# Patient Record
Sex: Female | Born: 1965 | Race: Black or African American | Hispanic: No | Smoking: Never smoker
Health system: Southern US, Community
[De-identification: ages and names within clinical notes are randomized; demographics above are authoritative.]

## PROBLEM LIST (undated history)

## (undated) DIAGNOSIS — T7840XA Allergy, unspecified, initial encounter: Secondary | ICD-10-CM

## (undated) HISTORY — DX: Allergy, unspecified, initial encounter: T78.40XA

---

## 2004-07-08 ENCOUNTER — Other Ambulatory Visit: Admission: RE | Admit: 2004-07-08 | Discharge: 2004-07-08 | Payer: Self-pay | Admitting: Obstetrics and Gynecology

## 2012-03-13 DIAGNOSIS — K589 Irritable bowel syndrome without diarrhea: Secondary | ICD-10-CM | POA: Insufficient documentation

## 2012-03-13 DIAGNOSIS — Z87898 Personal history of other specified conditions: Secondary | ICD-10-CM | POA: Insufficient documentation

## 2012-03-13 DIAGNOSIS — K219 Gastro-esophageal reflux disease without esophagitis: Secondary | ICD-10-CM | POA: Insufficient documentation

## 2013-07-31 ENCOUNTER — Other Ambulatory Visit (HOSPITAL_COMMUNITY): Payer: Self-pay | Admitting: *Deleted

## 2014-01-06 DIAGNOSIS — D126 Benign neoplasm of colon, unspecified: Secondary | ICD-10-CM | POA: Insufficient documentation

## 2014-01-13 ENCOUNTER — Telehealth (HOSPITAL_COMMUNITY): Payer: Self-pay | Admitting: *Deleted

## 2014-02-12 ENCOUNTER — Telehealth (HOSPITAL_COMMUNITY): Payer: Self-pay | Admitting: *Deleted

## 2014-02-17 ENCOUNTER — Other Ambulatory Visit (HOSPITAL_COMMUNITY): Payer: Self-pay | Admitting: Family Medicine

## 2014-02-17 ENCOUNTER — Encounter (HOSPITAL_COMMUNITY): Payer: Self-pay | Admitting: *Deleted

## 2014-02-17 DIAGNOSIS — R072 Precordial pain: Secondary | ICD-10-CM

## 2014-03-05 ENCOUNTER — Telehealth (HOSPITAL_COMMUNITY): Payer: Self-pay

## 2014-03-05 NOTE — Telephone Encounter (Signed)
Encounter complete. 

## 2014-03-07 ENCOUNTER — Encounter (HOSPITAL_COMMUNITY): Payer: Self-pay

## 2014-03-10 ENCOUNTER — Encounter (HOSPITAL_COMMUNITY): Payer: Self-pay

## 2014-03-10 ENCOUNTER — Telehealth (HOSPITAL_COMMUNITY): Payer: Self-pay | Admitting: *Deleted

## 2014-03-25 ENCOUNTER — Telehealth (HOSPITAL_COMMUNITY): Payer: Self-pay

## 2014-03-25 NOTE — Telephone Encounter (Signed)
Encounter complete. 

## 2014-03-27 ENCOUNTER — Ambulatory Visit (HOSPITAL_COMMUNITY)
Admission: RE | Admit: 2014-03-27 | Discharge: 2014-03-27 | Disposition: A | Discharge status: Home / Self Care | Payer: BC Managed Care – PPO | Source: Ambulatory Visit | Attending: Family Medicine | Admitting: Family Medicine

## 2014-03-27 DIAGNOSIS — R072 Precordial pain: Secondary | ICD-10-CM | POA: Diagnosis present

## 2014-03-27 NOTE — Procedures (Signed)
Exercise Treadmill Test  Pre-Exercise Testing Evaluation Rhythm: normal sinus                  Test  Exercise Tolerance Test Ordering MD: Billey Chang, MD    Unique Test No: 1  Treadmill:  1  Indication for ETT: chest pain - rule out ischemia  Contraindication to ETT: No   Stress Modality: exercise - treadmill  Cardiac Imaging Performed: non   Protocol: standard Bruce - maximal  Max BP:  136/96  Max MPHR (bpm):  170 85% MPR (bpm):  144  MPHR obtained (bpm):  184 %MPHR:  108  Reached 85% MPHR (min:sec):  6 Total Exercise Time (min-sec):  10  Workload in METS:  11.7 Borg Scale: 16  Reason ETT Terminated:  General and Leg Fatigue    ST Segment Analysis At Rest: normal ST segments - no evidence of significant ST depression With Exercise: no evidence of significant ST depression  Other Information Arrhythmia:  No Angina during ETT:  absent (0) Quality of ETT:  diagnostic  ETT Interpretation:  normal - no evidence of ischemia by ST analysis  Comments: Nl GXT  Recommendations: Follow up with Dr. Jonni Sanger

## 2014-06-20 ENCOUNTER — Other Ambulatory Visit: Payer: Self-pay | Admitting: Obstetrics and Gynecology

## 2014-06-22 NOTE — Patient Instructions (Signed)
   Your procedure is scheduled on:  Thursday, May 19  Enter through the Main Entrance of [facility] at: 9:15 AM Pick up the phone at the desk and dial [phone] and inform us of your arrival.  Please call this number if you have any problems the morning of surgery: [phone]  Remember: Do not eat or drink after midnight: Wednesday Take these medicines the morning of surgery with a SIP OF WATER:  Do not wear jewelry, make-up, or FINGER nail polish No metal in your hair or on your body. Do not wear lotions, powders, perfumes.  You may wear deodorant.  Do not bring valuables to the hospital. Contacts, dentures or bridgework may not be worn into surgery.  Leave suitcase in the car. After Surgery it may be brought to your room. For patients being admitted to the hospital, checkout time is 11:00am the day of discharge.    Patients discharged on the day of surgery will not be allowed to drive home.

## 2014-06-23 ENCOUNTER — Inpatient Hospital Stay (HOSPITAL_COMMUNITY)
Admission: RE | Admit: 2014-06-23 | Discharge: 2014-06-23 | Disposition: A | Discharge status: Home / Self Care | Payer: BC Managed Care – PPO | Source: Ambulatory Visit

## 2014-06-27 ENCOUNTER — Encounter (HOSPITAL_COMMUNITY): Payer: Self-pay

## 2014-06-27 ENCOUNTER — Encounter (HOSPITAL_COMMUNITY)
Admission: RE | Admit: 2014-06-27 | Discharge: 2014-06-27 | Disposition: A | Discharge status: Home / Self Care | Payer: BC Managed Care – PPO | Source: Ambulatory Visit | Attending: Obstetrics and Gynecology | Admitting: Obstetrics and Gynecology

## 2014-06-27 DIAGNOSIS — Z01818 Encounter for other preprocedural examination: Secondary | ICD-10-CM | POA: Insufficient documentation

## 2014-06-27 LAB — BASIC METABOLIC PANEL
Anion gap: 11 (ref 5–15)
BUN: 16 mg/dL (ref 6–20)
CALCIUM: 8.9 mg/dL (ref 8.9–10.3)
CO2: 23 mmol/L (ref 22–32)
CREATININE: 1.02 mg/dL — AB (ref 0.44–1.00)
Chloride: 102 mmol/L (ref 101–111)
Glucose, Bld: 105 mg/dL — ABNORMAL HIGH (ref 65–99)
Potassium: 3.9 mmol/L (ref 3.5–5.1)
SODIUM: 136 mmol/L (ref 135–145)

## 2014-06-27 LAB — CBC
HEMATOCRIT: 38.1 % (ref 36.0–46.0)
Hemoglobin: 12.9 g/dL (ref 12.0–15.0)
MCH: 30.9 pg (ref 26.0–34.0)
MCHC: 33.9 g/dL (ref 30.0–36.0)
MCV: 91.1 fL (ref 78.0–100.0)
Platelets: 318 10*3/uL (ref 150–400)
RBC: 4.18 MIL/uL (ref 3.87–5.11)
RDW: 13.4 % (ref 11.5–15.5)
WBC: 6.6 10*3/uL (ref 4.0–10.5)

## 2014-06-27 NOTE — Patient Instructions (Addendum)
   Your procedure is scheduled on: Jul 03 2014   Enter through the Main Entrance of [facility] at : [place] up the phone at the desk and dial [phone] and inform us of your arrival.  Please call this number if you have any problems the morning of surgery: [phone]  Remember: Do not eat food after midnight: MAY 18 Do not drink clear liquids after: 630AM DAY OF SURGERY Take these medicines the morning of surgery with a SIP OF WATER:  NONE  Do not wear jewelry, make-up, or FINGER nail polish No metal in your hair or on your body. Do not wear lotions, powders, perfumes.  You may wear deodorant.  Do not bring valuables to the hospital. Contacts, dentures or bridgework may not be worn into surgery.  Leave suitcase in the car. After Surgery it may be brought to your room. For patients being admitted to the hospital, checkout time is 11:00am the day of discharge.

## 2014-07-02 MED ORDER — CIPROFLOXACIN IN D5W 400 MG/200ML IV SOLN
400.0000 mg | INTRAVENOUS | Status: AC
  Administered 2014-07-03: 400 mg via INTRAVENOUS
  Filled 2014-07-02: qty 200

## 2014-07-02 MED ORDER — CLINDAMYCIN PHOSPHATE 900 MG/50ML IV SOLN
900.0000 mg | INTRAVENOUS | Status: AC
Start: 2014-07-03 — End: 2014-07-03
  Administered 2014-07-03: 900 mg via INTRAVENOUS
  Filled 2014-07-02: qty 50

## 2014-07-03 ENCOUNTER — Encounter (HOSPITAL_COMMUNITY): Payer: Self-pay | Admitting: *Deleted

## 2014-07-03 ENCOUNTER — Inpatient Hospital Stay (HOSPITAL_COMMUNITY)
Admission: RE | Admit: 2014-07-03 | Discharge: 2014-07-05 | DRG: 743 | Disposition: A | Discharge status: Home / Self Care | Payer: BC Managed Care – PPO | Source: Ambulatory Visit | Attending: Obstetrics and Gynecology | Admitting: Obstetrics and Gynecology

## 2014-07-03 ENCOUNTER — Ambulatory Visit (HOSPITAL_COMMUNITY): Payer: BC Managed Care – PPO | Admitting: Anesthesiology

## 2014-07-03 ENCOUNTER — Encounter (HOSPITAL_COMMUNITY)
Admission: RE | Disposition: A | Discharge status: Home / Self Care | Payer: Self-pay | Source: Ambulatory Visit | Attending: Obstetrics and Gynecology

## 2014-07-03 DIAGNOSIS — N802 Endometriosis of fallopian tube: Secondary | ICD-10-CM | POA: Diagnosis present

## 2014-07-03 DIAGNOSIS — R102 Pelvic and perineal pain: Secondary | ICD-10-CM | POA: Diagnosis present

## 2014-07-03 DIAGNOSIS — N857 Hematometra: Secondary | ICD-10-CM | POA: Diagnosis present

## 2014-07-03 DIAGNOSIS — N736 Female pelvic peritoneal adhesions (postinfective): Secondary | ICD-10-CM | POA: Diagnosis present

## 2014-07-03 DIAGNOSIS — K5730 Diverticulosis of large intestine without perforation or abscess without bleeding: Secondary | ICD-10-CM | POA: Diagnosis present

## 2014-07-03 DIAGNOSIS — Z90710 Acquired absence of both cervix and uterus: Secondary | ICD-10-CM | POA: Diagnosis present

## 2014-07-03 DIAGNOSIS — Z90721 Acquired absence of ovaries, unilateral: Secondary | ICD-10-CM

## 2014-07-03 DIAGNOSIS — N801 Endometriosis of ovary: Secondary | ICD-10-CM | POA: Diagnosis present

## 2014-07-03 HISTORY — PX: OOPHORECTOMY: SHX6387

## 2014-07-03 HISTORY — PX: ROBOTIC ASSISTED TOTAL HYSTERECTOMY: SHX6085

## 2014-07-03 HISTORY — PX: BILATERAL SALPINGECTOMY: SHX5743

## 2014-07-03 HISTORY — PX: ABDOMINAL HYSTERECTOMY: SHX81

## 2014-07-03 SURGERY — ROBOTIC ASSISTED TOTAL HYSTERECTOMY
Anesthesia: General | Site: Abdomen | Laterality: Right

## 2014-07-03 MED ORDER — ALBUMIN HUMAN 5 % IV SOLN
INTRAVENOUS | Status: AC
  Filled 2014-07-03: qty 250

## 2014-07-03 MED ORDER — PROPOFOL 10 MG/ML IV BOLUS
INTRAVENOUS | Status: DC | PRN
  Administered 2014-07-03: 200 mg via INTRAVENOUS

## 2014-07-03 MED ORDER — NEOSTIGMINE METHYLSULFATE 10 MG/10ML IV SOLN
INTRAVENOUS | Status: AC
  Filled 2014-07-03: qty 1

## 2014-07-03 MED ORDER — DEXAMETHASONE SODIUM PHOSPHATE 10 MG/ML IJ SOLN
INTRAMUSCULAR | Status: DC | PRN
  Administered 2014-07-03: 4 mg via INTRAVENOUS

## 2014-07-03 MED ORDER — PROPOFOL 10 MG/ML IV BOLUS
INTRAVENOUS | Status: AC
  Filled 2014-07-03: qty 20

## 2014-07-03 MED ORDER — ONDANSETRON HCL 4 MG/2ML IJ SOLN
INTRAMUSCULAR | Status: DC | PRN
  Administered 2014-07-03: 4 mg via INTRAVENOUS

## 2014-07-03 MED ORDER — HYDROMORPHONE HCL 1 MG/ML IJ SOLN
INTRAMUSCULAR | Status: AC
  Filled 2014-07-03: qty 1

## 2014-07-03 MED ORDER — HYDROMORPHONE 0.3 MG/ML IV SOLN
INTRAVENOUS | Status: DC
  Administered 2014-07-03: 19:00:00 via INTRAVENOUS
  Administered 2014-07-03: 1.39 mg via INTRAVENOUS
  Administered 2014-07-04: 0.399 mg via INTRAVENOUS
  Filled 2014-07-03: qty 25

## 2014-07-03 MED ORDER — LACTATED RINGERS IV SOLN
INTRAVENOUS | Status: DC
  Administered 2014-07-03 (×2): via INTRAVENOUS

## 2014-07-03 MED ORDER — DIPHENHYDRAMINE HCL 50 MG/ML IJ SOLN: 12.5000 mg | Freq: Four times a day (QID) | INTRAMUSCULAR | Status: DC | PRN

## 2014-07-03 MED ORDER — BUPIVACAINE HCL (PF) 0.25 % IJ SOLN
INTRAMUSCULAR | Status: AC
  Filled 2014-07-03: qty 30

## 2014-07-03 MED ORDER — OXYCODONE-ACETAMINOPHEN 5-325 MG PO TABS: 1.0000 | ORAL_TABLET | ORAL | Status: DC | PRN

## 2014-07-03 MED ORDER — KETOROLAC TROMETHAMINE 30 MG/ML IJ SOLN: 30.0000 mg | Freq: Once | INTRAMUSCULAR | Status: DC | PRN

## 2014-07-03 MED ORDER — DIPHENHYDRAMINE HCL 12.5 MG/5ML PO ELIX: 12.5000 mg | ORAL_SOLUTION | Freq: Four times a day (QID) | ORAL | Status: DC | PRN

## 2014-07-03 MED ORDER — ROCURONIUM BROMIDE 100 MG/10ML IV SOLN
INTRAVENOUS | Status: AC
  Filled 2014-07-03: qty 1

## 2014-07-03 MED ORDER — DEXTROSE 5 % IV SOLN
20.0000 mg | INTRAVENOUS | Status: DC | PRN
  Administered 2014-07-03: 20 ug/min via INTRAVENOUS

## 2014-07-03 MED ORDER — DEXTROSE IN LACTATED RINGERS 5 % IV SOLN
INTRAVENOUS | Status: DC
  Administered 2014-07-04 (×2): via INTRAVENOUS

## 2014-07-03 MED ORDER — ONDANSETRON HCL 4 MG/2ML IJ SOLN: 4.0000 mg | Freq: Four times a day (QID) | INTRAMUSCULAR | Status: DC | PRN

## 2014-07-03 MED ORDER — PHENYLEPHRINE HCL 10 MG/ML IJ SOLN
INTRAMUSCULAR | Status: DC | PRN
  Administered 2014-07-03 (×3): .08 mg via INTRAVENOUS
  Administered 2014-07-03: .12 mg via INTRAVENOUS

## 2014-07-03 MED ORDER — ROCURONIUM BROMIDE 100 MG/10ML IV SOLN
INTRAVENOUS | Status: DC | PRN
  Administered 2014-07-03 (×3): 10 mg via INTRAVENOUS
  Administered 2014-07-03: 40 mg via INTRAVENOUS
  Administered 2014-07-03: 10 mg via INTRAVENOUS
  Administered 2014-07-03: 20 mg via INTRAVENOUS

## 2014-07-03 MED ORDER — SODIUM CHLORIDE 0.9 % IJ SOLN: 9.0000 mL | INTRAMUSCULAR | Status: DC | PRN

## 2014-07-03 MED ORDER — MIDAZOLAM HCL 2 MG/2ML IJ SOLN
INTRAMUSCULAR | Status: DC | PRN
  Administered 2014-07-03: 2 mg via INTRAVENOUS

## 2014-07-03 MED ORDER — KETOROLAC TROMETHAMINE 30 MG/ML IJ SOLN
30.0000 mg | Freq: Four times a day (QID) | INTRAMUSCULAR | Status: DC
  Administered 2014-07-03 – 2014-07-05 (×6): 30 mg via INTRAVENOUS
  Filled 2014-07-03 (×6): qty 1

## 2014-07-03 MED ORDER — PROMETHAZINE HCL 25 MG/ML IJ SOLN
6.2500 mg | INTRAMUSCULAR | Status: DC | PRN
Start: 2014-07-03 — End: 2014-07-03

## 2014-07-03 MED ORDER — BUPIVACAINE HCL (PF) 0.25 % IJ SOLN
INTRAMUSCULAR | Status: DC | PRN
  Administered 2014-07-03: 10 mL

## 2014-07-03 MED ORDER — GLYCOPYRROLATE 0.2 MG/ML IJ SOLN
INTRAMUSCULAR | Status: AC
  Filled 2014-07-03: qty 2

## 2014-07-03 MED ORDER — LACTATED RINGERS IR SOLN
Status: DC | PRN
  Administered 2014-07-03: 3000 mL

## 2014-07-03 MED ORDER — ROPIVACAINE HCL 5 MG/ML IJ SOLN
INTRAMUSCULAR | Status: AC
  Filled 2014-07-03: qty 60

## 2014-07-03 MED ORDER — NON FORMULARY: 60.0000 mg | Freq: Every day | Status: DC

## 2014-07-03 MED ORDER — SCOPOLAMINE 1 MG/3DAYS TD PT72
1.0000 | MEDICATED_PATCH | Freq: Once | TRANSDERMAL | Status: DC
  Administered 2014-07-03: 1.5 mg via TRANSDERMAL

## 2014-07-03 MED ORDER — HYDROMORPHONE HCL 1 MG/ML IJ SOLN
INTRAMUSCULAR | Status: DC | PRN
Start: 2014-07-03 — End: 2014-07-03
  Administered 2014-07-03 (×2): .5 mg via INTRAVENOUS

## 2014-07-03 MED ORDER — ONDANSETRON HCL 4 MG/2ML IJ SOLN
INTRAMUSCULAR | Status: AC
  Filled 2014-07-03: qty 2

## 2014-07-03 MED ORDER — ONDANSETRON HCL 4 MG PO TABS: 4.0000 mg | ORAL_TABLET | Freq: Four times a day (QID) | ORAL | Status: DC | PRN

## 2014-07-03 MED ORDER — PHENYLEPHRINE 40 MCG/ML (10ML) SYRINGE FOR IV PUSH (FOR BLOOD PRESSURE SUPPORT)
PREFILLED_SYRINGE | INTRAVENOUS | Status: AC
  Filled 2014-07-03: qty 20

## 2014-07-03 MED ORDER — FENTANYL CITRATE (PF) 100 MCG/2ML IJ SOLN
INTRAMUSCULAR | Status: AC
Start: 2014-07-03 — End: 2014-07-03
  Filled 2014-07-03: qty 2

## 2014-07-03 MED ORDER — SODIUM CHLORIDE 0.9 % IJ SOLN
INTRAMUSCULAR | Status: AC
  Filled 2014-07-03: qty 10

## 2014-07-03 MED ORDER — SIMETHICONE 80 MG PO CHEW: 80.0000 mg | CHEWABLE_TABLET | Freq: Four times a day (QID) | ORAL | Status: DC | PRN

## 2014-07-03 MED ORDER — SCOPOLAMINE 1 MG/3DAYS TD PT72
MEDICATED_PATCH | TRANSDERMAL | Status: AC
  Filled 2014-07-03: qty 1

## 2014-07-03 MED ORDER — ALBUMIN HUMAN 5 % IV SOLN
INTRAVENOUS | Status: DC | PRN
  Administered 2014-07-03 (×2): via INTRAVENOUS

## 2014-07-03 MED ORDER — LIDOCAINE HCL (CARDIAC) 20 MG/ML IV SOLN
INTRAVENOUS | Status: DC | PRN
  Administered 2014-07-03: 100 mg via INTRAVENOUS

## 2014-07-03 MED ORDER — LIDOCAINE HCL (CARDIAC) 20 MG/ML IV SOLN
INTRAVENOUS | Status: AC
  Filled 2014-07-03: qty 5

## 2014-07-03 MED ORDER — HYDROMORPHONE HCL 1 MG/ML IJ SOLN: 0.2000 mg | INTRAMUSCULAR | Status: DC | PRN

## 2014-07-03 MED ORDER — ARTIFICIAL TEARS OP OINT
TOPICAL_OINTMENT | OPHTHALMIC | Status: AC
  Filled 2014-07-03: qty 3.5

## 2014-07-03 MED ORDER — NALOXONE HCL 0.4 MG/ML IJ SOLN: 0.4000 mg | INTRAMUSCULAR | Status: DC | PRN

## 2014-07-03 MED ORDER — HYDROMORPHONE HCL 1 MG/ML IJ SOLN
0.2500 mg | INTRAMUSCULAR | Status: DC | PRN
  Administered 2014-07-03 (×3): 0.5 mg via INTRAVENOUS

## 2014-07-03 MED ORDER — FENTANYL CITRATE (PF) 250 MCG/5ML IJ SOLN
INTRAMUSCULAR | Status: AC
  Filled 2014-07-03: qty 5

## 2014-07-03 MED ORDER — MIDAZOLAM HCL 2 MG/2ML IJ SOLN
INTRAMUSCULAR | Status: AC
  Filled 2014-07-03: qty 2

## 2014-07-03 MED ORDER — GLYCOPYRROLATE 0.2 MG/ML IJ SOLN
INTRAMUSCULAR | Status: DC | PRN
  Administered 2014-07-03: .4 mg via INTRAVENOUS

## 2014-07-03 MED ORDER — FENTANYL CITRATE (PF) 100 MCG/2ML IJ SOLN
INTRAMUSCULAR | Status: DC | PRN
  Administered 2014-07-03: 50 ug via INTRAVENOUS
  Administered 2014-07-03: 100 ug via INTRAVENOUS
  Administered 2014-07-03: 75 ug via INTRAVENOUS
  Administered 2014-07-03: 25 ug via INTRAVENOUS
  Administered 2014-07-03: 100 ug via INTRAVENOUS

## 2014-07-03 MED ORDER — NEOSTIGMINE METHYLSULFATE 10 MG/10ML IV SOLN
INTRAVENOUS | Status: DC | PRN
  Administered 2014-07-03: 3 mg via INTRAVENOUS

## 2014-07-03 MED ORDER — KETOROLAC TROMETHAMINE 30 MG/ML IJ SOLN: 30.0000 mg | Freq: Four times a day (QID) | INTRAMUSCULAR | Status: DC

## 2014-07-03 MED ORDER — MEPERIDINE HCL 25 MG/ML IJ SOLN: 6.2500 mg | INTRAMUSCULAR | Status: DC | PRN

## 2014-07-03 MED ORDER — MENTHOL 3 MG MT LOZG
1.0000 | LOZENGE | OROMUCOSAL | Status: DC | PRN
Start: 2014-07-03 — End: 2014-07-05

## 2014-07-03 MED ORDER — DEXAMETHASONE SODIUM PHOSPHATE 4 MG/ML IJ SOLN
INTRAMUSCULAR | Status: AC
  Filled 2014-07-03: qty 1

## 2014-07-03 MED ORDER — PHENYLEPHRINE 8 MG IN D5W 100 ML (0.08MG/ML) PREMIX OPTIME
INJECTION | INTRAVENOUS | Status: AC
  Filled 2014-07-03: qty 100

## 2014-07-03 MED ORDER — IBUPROFEN 800 MG PO TABS: 800.0000 mg | ORAL_TABLET | Freq: Three times a day (TID) | ORAL | Status: DC | PRN

## 2014-07-03 MED ORDER — LINACLOTIDE 290 MCG PO CAPS
290.0000 ug | ORAL_CAPSULE | Freq: Every day | ORAL | Status: DC
  Filled 2014-07-03: qty 1

## 2014-07-03 MED ORDER — SODIUM CHLORIDE 0.9 % IJ SOLN
INTRAMUSCULAR | Status: AC
Start: 2014-07-03 — End: 2014-07-03
  Filled 2014-07-03: qty 50

## 2014-07-03 MED ORDER — PANTOPRAZOLE SODIUM 40 MG PO TBEC
40.0000 mg | DELAYED_RELEASE_TABLET | Freq: Every day | ORAL | Status: DC
  Administered 2014-07-03 – 2014-07-04 (×2): 40 mg via ORAL
  Filled 2014-07-03 (×3): qty 1

## 2014-07-03 SURGICAL SUPPLY — 75 items
BARRIER ADHS 3X4 INTERCEED (GAUZE/BANDAGES/DRESSINGS) ×6 IMPLANT
BRR ADH 4X3 ABS CNTRL BYND (GAUZE/BANDAGES/DRESSINGS) ×4
CATH FOLEY 3WAY  5CC 16FR (CATHETERS) ×2
CATH FOLEY 3WAY 5CC 16FR (CATHETERS) ×4 IMPLANT
CLOTH BEACON ORANGE TIMEOUT ST (SAFETY) ×6 IMPLANT
CONT PATH 16OZ SNAP LID 3702 (MISCELLANEOUS) ×6 IMPLANT
COVER BACK TABLE 60X90IN (DRAPES) ×12 IMPLANT
COVER TIP SHEARS 8 DVNC (MISCELLANEOUS) ×4 IMPLANT
COVER TIP SHEARS 8MM DA VINCI (MISCELLANEOUS) ×2
DECANTER SPIKE VIAL GLASS SM (MISCELLANEOUS) ×6 IMPLANT
DEVICE TROCAR PUNCTURE CLOSURE (ENDOMECHANICALS) IMPLANT
DILATOR CANAL MILEX (MISCELLANEOUS) ×2 IMPLANT
DRAPE CAMERA HEAD DAVINCI SI (DRAPES) ×2 IMPLANT
DRAPE WARM FLUID 44X44 (DRAPE) ×6 IMPLANT
DRSG OPSITE POSTOP 3X4 (GAUZE/BANDAGES/DRESSINGS) ×2 IMPLANT
DRSG OPSITE POSTOP 4X12 (GAUZE/BANDAGES/DRESSINGS) ×2 IMPLANT
DURAPREP 26ML APPLICATOR (WOUND CARE) ×6 IMPLANT
ELECT LIGASURE SHORT 9 REUSE (ELECTRODE) ×2 IMPLANT
ELECT REM PT RETURN 9FT ADLT (ELECTROSURGICAL) ×6
ELECTRODE REM PT RTRN 9FT ADLT (ELECTROSURGICAL) ×4 IMPLANT
EXTENDER ELECT LOOP LEEP 10CM (CUTTING LOOP) ×2 IMPLANT
FORCEPS CUTTING 33CM 5MM (CUTTING FORCEPS) ×2 IMPLANT
GAUZE SPONGE 4X4 16PLY XRAY LF (GAUZE/BANDAGES/DRESSINGS) ×2 IMPLANT
GAUZE VASELINE 3X9 (GAUZE/BANDAGES/DRESSINGS) IMPLANT
GLOVE BIOGEL PI IND STRL 7.0 (GLOVE) ×16 IMPLANT
GLOVE BIOGEL PI INDICATOR 7.0 (GLOVE) ×8
GLOVE ECLIPSE 6.5 STRL STRAW (GLOVE) ×6 IMPLANT
HEMOSTAT SURGICEL 4X8 (HEMOSTASIS) ×2 IMPLANT
KIT ACCESSORY DA VINCI DISP (KITS) ×2
KIT ACCESSORY DVNC DISP (KITS) ×4 IMPLANT
LEGGING LITHOTOMY PAIR STRL (DRAPES) ×6 IMPLANT
LIQUID BAND (GAUZE/BANDAGES/DRESSINGS) ×6 IMPLANT
NDL SPNL 22GX7 QUINCKE BK (NEEDLE) IMPLANT
NEEDLE INSUFFLATION 150MM (ENDOMECHANICALS) ×6 IMPLANT
NEEDLE SPNL 22GX7 QUINCKE BK (NEEDLE) ×6 IMPLANT
OCCLUDER COLPOPNEUMO (BALLOONS) IMPLANT
PACK ROBOT WH (CUSTOM PROCEDURE TRAY) ×6 IMPLANT
PACK ROBOTIC GOWN (GOWN DISPOSABLE) ×6 IMPLANT
PAD POSITIONER PINK NONSTERILE (MISCELLANEOUS) ×6 IMPLANT
PAD PREP 24X48 CUFFED NSTRL (MISCELLANEOUS) ×12 IMPLANT
RTRCTR C-SECT PINK 25CM LRG (MISCELLANEOUS) ×2 IMPLANT
SCISSORS LAP 5X35 DISP (ENDOMECHANICALS) IMPLANT
SET CYSTO W/LG BORE CLAMP LF (SET/KITS/TRAYS/PACK) IMPLANT
SET IRRIG TUBING LAPAROSCOPIC (IRRIGATION / IRRIGATOR) ×6 IMPLANT
SET TRI-LUMEN FLTR TB AIRSEAL (TUBING) ×2 IMPLANT
SHEARS HARMONIC ACE PLUS 36CM (ENDOMECHANICALS) ×2 IMPLANT
SUT VIC AB 0 CT1 18XCR BRD8 (SUTURE) IMPLANT
SUT VIC AB 0 CT1 27 (SUTURE) ×12
SUT VIC AB 0 CT1 27XBRD ANBCTR (SUTURE) IMPLANT
SUT VIC AB 0 CT1 36 (SUTURE) ×16 IMPLANT
SUT VIC AB 0 CT1 8-18 (SUTURE) ×12
SUT VIC AB 3-0 SH 27 (SUTURE) ×6
SUT VIC AB 3-0 SH 27X BRD (SUTURE) IMPLANT
SUT VIC AB 4-0 PS2 27 (SUTURE) ×4 IMPLANT
SUT VICRYL 0 TIES 12 18 (SUTURE) ×2 IMPLANT
SUT VICRYL 0 UR6 27IN ABS (SUTURE) ×8 IMPLANT
SUT VICRYL 4-0 PS2 18IN ABS (SUTURE) ×12 IMPLANT
SUT VLOC 180 0 9IN  GS21 (SUTURE) ×2
SUT VLOC 180 0 9IN GS21 (SUTURE) ×4 IMPLANT
SYR 50ML LL SCALE MARK (SYRINGE) ×6 IMPLANT
SYRINGE 10CC LL (SYRINGE) ×6 IMPLANT
TIP RUMI ORANGE 6.7MMX12CM (TIP) ×2 IMPLANT
TIP UTERINE 5.1X6CM LAV DISP (MISCELLANEOUS) IMPLANT
TIP UTERINE 6.7X10CM GRN DISP (MISCELLANEOUS) ×2 IMPLANT
TIP UTERINE 6.7X6CM WHT DISP (MISCELLANEOUS) IMPLANT
TIP UTERINE 6.7X8CM BLUE DISP (MISCELLANEOUS) ×2 IMPLANT
TOWEL OR 17X24 6PK STRL BLUE (TOWEL DISPOSABLE) ×18 IMPLANT
TROCAR DISP BLADELESS 8 DVNC (TROCAR) IMPLANT
TROCAR DISP BLADELESS 8MM (TROCAR)
TROCAR PORT AIRSEAL 5X120 (TROCAR) ×2 IMPLANT
TROCAR XCEL 12X100 BLDLESS (ENDOMECHANICALS) ×6 IMPLANT
TROCAR XCEL NON-BLD 5MMX100MML (ENDOMECHANICALS) ×12 IMPLANT
TROCAR Z-THREAD 12X150 (TROCAR) ×6 IMPLANT
WARMER LAPAROSCOPE (MISCELLANEOUS) ×6 IMPLANT
WATER STERILE IRR 1000ML POUR (IV SOLUTION) ×18 IMPLANT

## 2014-07-03 NOTE — OR Nursing (Addendum)
Dr. Excell Seltzer paged at [phone].  Dr. Excell Seltzer returned page at 1145 and states he is in surgery.  General surgeon office called at [phone]  Crystal at general surgeon office returned call at 1216 and states Dr. Grandville Silos will arrive to OR room 7 at [facility] at 1240.

## 2014-07-03 NOTE — Op Note (Signed)
07/03/2014  3:09 PM  PATIENT:  Sylvia Mcdonald  49 y.o. female  PRE-OPERATIVE DIAGNOSIS:  Adhesions from the sigmoid and rectosigmoid colon to the adnexa and uterus  POST-OPERATIVE DIAGNOSIS:  Same, sigmoid diverticulosis  PROCEDURE:  Procedure(s): Lysis of adhesions for 30 minutes  SURGEON:  Georganna Skeans, M.D.  ASSISTANTS: Servando Salina, M.D.   ANESTHESIA:   general  EBL:  Total I/O In: 2500 [I.V.:2000; IV Piggyback:500] Out: 635 [Urine:235; Blood:400]  BLOOD ADMINISTERED:none  SPECIMEN:  No Specimen  DISPOSITION OF SPECIMEN:  N/A  COUNTS:  YES  DICTATION: .Dragon Dictation I was called for an operative consult by Dr. Garwin Brothers. She was attempting a robotic hysterectomy and encountered adhesions from the sigmoid colon and rectosigmoid colon to the adnexa and uterus. On my arrival, the robot had been undocked. We explored the pelvis laparoscopically. Adhesions were noted from the sigmoid colon to the left adnexa and there was some adhesions posteriorly from the rectosigmoid to the posterior portion of the uterus. We could not safely determine the border of the posterior portion of the uterus to the: So the decision was made to open. Dr. Garwin Brothers did a Pfannenstiel incision and gained access to the abdomen. Next, we carefully took down the adhesions between the left adnexa and the sigmoid colon. The sigmoid colon was noted to have several diverticuli without active diverticulitis. There were some small cysts along the appendices epiploicae which were removed. Next using blunt dissection we were able to free the colon from the posterior portion of the uterus. The rectosigmoid colon appeared intact without any injuries. I also explored the lateral aspect on the right side where previous dissection had been done and no perforation of the colon was found. Total adhesiolysis was 30 minutes. Next, Dr. Garwin Brothers proceeded with the hysterectomy. The colon was protected throughout. It was  rechecked after the uterus was removed and again appeared intact. This completed my portion of the procedure.  PATIENT DISPOSITION:  Remains in the OR with Dr. Garwin Brothers   Delay start of Pharmacological VTE agent (>24hrs) due to surgical blood loss or risk of bleeding:  no  Georganna Skeans, MD, MPH, FACS Pager: [phone]  5/19/20163:09 PM

## 2014-07-03 NOTE — Anesthesia Procedure Notes (Signed)
Procedure Name: Intubation Date/Time: 07/03/2014 10:59 AM Performed by: Jonna Munro Pre-anesthesia Checklist: Patient identified, Timeout performed, Emergency Drugs available, Suction available and Patient being monitored Patient Re-evaluated:Patient Re-evaluated prior to inductionOxygen Delivery Method: Circle system utilized Preoxygenation: Pre-oxygenation with 100% oxygen Intubation Type: IV induction Ventilation: Mask ventilation without difficulty Laryngoscope Size: Mac and 3 Grade View: Grade I Tube type: Oral Tube size: 7.0 mm Number of attempts: 2 (Grade 1 view with both attempts; inability to pass through cords on first attempt) Airway Equipment and Method: Stylet and Bite block Placement Confirmation: ETT inserted through vocal cords under direct vision,  breath sounds checked- equal and bilateral and positive ETCO2 Secured at: 22 cm Tube secured with: Tape Dental Injury: Teeth and Oropharynx as per pre-operative assessment

## 2014-07-03 NOTE — Transfer of Care (Signed)
Immediate Anesthesia Transfer of Care Note  Patient: Sylvia Mcdonald  Procedure(s) Performed: Procedure(s): ATTEMPTED ROBOTIC ASSISTED TOTAL HYSTERECTOMY (N/A) BILATERAL SALPINGECTOMY (Bilateral) HYSTERECTOMY ABDOMINAL (Bilateral) OOPHORECTOMY (Right)  Patient Location: PACU  Anesthesia Type:General  Level of Consciousness: awake  Airway & Oxygen Therapy: Patient Spontanous Breathing  Post-op Assessment: Report given to PACU RN  Post vital signs: stable  Filed Vitals:   07/03/14 0901  BP: 109/87  Pulse: 85  Temp: 37 C  Resp: 16    Complications: No apparent anesthesia complications

## 2014-07-03 NOTE — OR Nursing (Signed)
Dr. Grandville Silos arrived to OR 7 at 1249.

## 2014-07-03 NOTE — Brief Op Note (Signed)
07/03/2014  5:06 PM  PATIENT:  Sylvia Mcdonald  49 y.o. female  PRE-OPERATIVE DIAGNOSIS:  Status Post Endometrial Ablation, Pelvic Pain  POST-OPERATIVE DIAGNOSIS:  STATUS POST ENDOMETRIAL ABLATION, PELVIC PAIN, hematometria,   sigmoid diverticulosis, bowel/pelvic adhesions  PROCEDURE:  Procedure(s): ATTEMPTED ROBOTIC ASSISTED TOTAL HYSTERECTOMY (N/A) BILATERAL SALPINGECTOMY (Bilateral) HYSTERECTOMY ABDOMINAL (Bilateral) OOPHORECTOMY (Right), lysis of adhesions  SURGEON:  Surgeon(s) and Role:    * Georganna Skeans, MD - [place], MD - Primary  PHYSICIAN ASSISTANT:   ASSISTANTS: melanie bhambri cnm  Georganna Skeans  ANESTHESIA:   general Findings: hematometra, bowel adhesions to post uterus, partial obliteration of posterior cul de sac, pelvic adhesions, ureters seen bilaterally, enlarged uterus, nl liver edge. Appendix adherent to right side wall, enlarged left ovary with peri ovarian adhesions, right ov not seen ? Endometrioma, bilateral edematous tubes with adhesions EBL:  Total I/O In: 3000 [I.V.:2500; IV Piggyback:500] Out: 785 [Urine:285; Blood:500]  BLOOD ADMINISTERED:none  DRAINS: none   LOCAL MEDICATIONS USED:  MARCAINE     SPECIMEN:  Source of Specimen:  uters with cervix, right ov, both tubes  DISPOSITION OF SPECIMEN:  PATHOLOGY  COUNTS:  YES  TOURNIQUET:  * No tourniquets in log *  DICTATION: .Other Dictation: Dictation Number [id number]  PLAN OF CARE: Admit to inpatient   PATIENT DISPOSITION:  PACU - hemodynamically stable.   Delay start of Pharmacological VTE agent (>24hrs) due to surgical blood loss or risk of bleeding: no

## 2014-07-03 NOTE — Anesthesia Preprocedure Evaluation (Signed)
Anesthesia Evaluation  Patient identified by MRN, date of birth, ID band Patient awake    Reviewed: Allergy & Precautions, H&P , NPO status , Patient's Chart, lab work & pertinent test results  Airway Mallampati: II  TM Distance: >3 FB Neck ROM: full    Dental no notable dental hx. (+) Teeth Intact   Pulmonary neg pulmonary ROS,    Pulmonary exam normal       Cardiovascular negative cardio ROS Normal cardiovascular exam    Neuro/Psych negative neurological ROS  negative psych ROS   GI/Hepatic negative GI ROS, Neg liver ROS,   Endo/Other  negative endocrine ROS  Renal/GU negative Renal ROS     Musculoskeletal   Abdominal (+) + obese,   Peds  Hematology negative hematology ROS (+)   Anesthesia Other Findings   Reproductive/Obstetrics negative OB ROS                             Anesthesia Physical Anesthesia Plan  ASA: II  Anesthesia Plan: General   Post-op Pain Management:    Induction: Intravenous  Airway Management Planned: Oral ETT  Additional Equipment:   Intra-op Plan:   Post-operative Plan: Extubation in OR  Informed Consent: I have reviewed the patients History and Physical, chart, labs and discussed the procedure including the risks, benefits and alternatives for the proposed anesthesia with the patient or authorized representative who has indicated his/her understanding and acceptance.   Dental Advisory Given  Plan Discussed with: CRNA and Surgeon  Anesthesia Plan Comments:         Anesthesia Quick Evaluation

## 2014-07-03 NOTE — Anesthesia Postprocedure Evaluation (Signed)
Anesthesia Post Note  Patient: Sylvia Mcdonald  Procedure(s) Performed: Procedure(s) (LRB): ATTEMPTED ROBOTIC ASSISTED TOTAL HYSTERECTOMY (N/A) BILATERAL SALPINGECTOMY (Bilateral) HYSTERECTOMY ABDOMINAL (Bilateral) OOPHORECTOMY (Right)  Anesthesia type: General  Patient location: PACU  Post pain: Pain level controlled  Post assessment: Post-op Vital signs reviewed  Last Vitals:  Filed Vitals:   07/03/14 1715  BP: 112/63  Pulse: 89  Temp:   Resp: 12    Post vital signs: Reviewed  Level of consciousness: sedated  Complications: No apparent anesthesia complications

## 2014-07-04 LAB — BASIC METABOLIC PANEL
Anion gap: 8 (ref 5–15)
BUN: 6 mg/dL (ref 6–20)
CALCIUM: 8.4 mg/dL — AB (ref 8.9–10.3)
CO2: 24 mmol/L (ref 22–32)
CREATININE: 0.89 mg/dL (ref 0.44–1.00)
Chloride: 102 mmol/L (ref 101–111)
GFR calc Af Amer: >60 mL/min (ref >60)
GLUCOSE: 146 mg/dL — AB (ref 65–99)
Potassium: 4 mmol/L (ref 3.5–5.1)
SODIUM: 134 mmol/L — AB (ref 135–145)

## 2014-07-04 LAB — CBC
HEMATOCRIT: 30.2 % — AB (ref 36.0–46.0)
Hemoglobin: 10.1 g/dL — ABNORMAL LOW (ref 12.0–15.0)
MCH: 30.9 pg (ref 26.0–34.0)
MCHC: 33.4 g/dL (ref 30.0–36.0)
MCV: 92.4 fL (ref 78.0–100.0)
Platelets: 228 10*3/uL (ref 150–400)
RBC: 3.27 MIL/uL — ABNORMAL LOW (ref 3.87–5.11)
RDW: 13.9 % (ref 11.5–15.5)
WBC: 11 10*3/uL — ABNORMAL HIGH (ref 4.0–10.5)

## 2014-07-04 NOTE — Progress Notes (Signed)
Subjective: Patient reports tolerating PO and no problems voiding.    Objective: I have reviewed patient's vital signs.  vital signs, intake and output and labs. Filed Vitals:   07/04/14 1000  BP: 90/59  Pulse: 89  Temp: 99 F (37.2 C)  Resp: 18   I/O last 3 completed shifts: In: 4435.7 [P.O.:600; I.V.:3335.7; IV Piggyback:500] Out: 2710 [Urine:2210; Blood:500] Total I/O In: -  Out: 800 [Urine:800]  Lab Results  Component Value Date   WBC 11.0* 07/04/2014   HGB 10.1* 07/04/2014   HCT 30.2* 07/04/2014   MCV 92.4 07/04/2014   PLT 228 07/04/2014   Lab Results  Component Value Date   CREATININE 0.89 07/04/2014    EXAM General: alert, cooperative and no distress Resp: clear to auscultation bilaterally Cardio: regular rate and rhythm, S1, S2 normal, no murmur, click, rub or gallop GI: incision: clean, dry and intact and soft sl distended active BS Extremities: no edema, redness or tenderness in the calves or thighs Vaginal Bleeding: none      Assessment: s/p Procedure(s): ATTEMPTED ROBOTIC ASSISTED TOTAL  HYSTERECTOMY ABDOMINAL, RIGHT OOPHORECTOMY, BILATERAL SALPINGECTOMY: stable and progressing well  Plan: Advance diet Encourage ambulation Advance to PO medication  LOS: 1 day    Baleria Wyman A, MD 07/04/2014 1:37 PM    07/04/2014, 1:37 PM

## 2014-07-04 NOTE — Addendum Note (Signed)
Addendum  created 07/04/14 0732 by Raenette Rover, CRNA   Modules edited: Notes Section   Notes Section:  File: [id number]

## 2014-07-04 NOTE — Anesthesia Postprocedure Evaluation (Signed)
  Anesthesia Post-op Note  Patient: Sylvia Mcdonald  Procedure(s) Performed: Procedure(s): ATTEMPTED ROBOTIC ASSISTED TOTAL HYSTERECTOMY (N/A) BILATERAL SALPINGECTOMY (Bilateral) HYSTERECTOMY ABDOMINAL (Bilateral) OOPHORECTOMY (Right)  Patient Location: [facility] Unit  Anesthesia Type:General  Level of Consciousness: awake, alert , oriented and patient cooperative  Airway and Oxygen Therapy: Patient Spontanous Breathing  Post-op Pain: none  Post-op Assessment: Post-op Vital signs reviewed, Patient's Cardiovascular Status Stable, Respiratory Function Stable, Patent Airway and No signs of Nausea or vomiting  Post-op Vital Signs: Reviewed and stable  Last Vitals:  Filed Vitals:   07/04/14 0503  BP: 94/54  Pulse: 92  Temp: 36.5 C  Resp: 15    Complications: No apparent anesthesia complications

## 2014-07-05 MED ORDER — OXYCODONE-ACETAMINOPHEN 5-325 MG PO TABS: 1.0000 | ORAL_TABLET | ORAL | Status: DC | PRN

## 2014-07-05 MED ORDER — IBUPROFEN 800 MG PO TABS: 800.0000 mg | ORAL_TABLET | Freq: Three times a day (TID) | ORAL | Status: DC | PRN

## 2014-07-05 NOTE — Op Note (Signed)
Sylvia Mcdonald, Sylvia Mcdonald                 ACCOUNT NO.:  [id number]  MEDICAL RECORD NO.:  [id number]  LOCATION:  8921                          FACILITY:  [facility]  PHYSICIAN:  Servando Salina, M.D.DATE OF BIRTH:  05-Oct-1965  DATE OF PROCEDURE:  07/03/2014 DATE OF DISCHARGE:                              OPERATIVE REPORT   PREOPERATIVE DIAGNOSES:  History of endometrial ablation, pelvic pain.  PROCEDURE:  Diagnostic laparoscopy using robotic camera, exploratory laparotomy, total abdominal hysterectomy, bilateral salpingectomy, right oophorectomy, lysis of adhesions.  POSTOPERATIVE DIAGNOSIS: History of endometrial ablation, pelvic pain, hematometra,  Sigmoid diverticular disease, abdominopelvic adhesions  SURGEON:  Servando Salina, MD  ASSISTANT: [name]. Grandville Silos, M.D.            Julianne Handler, CNM  INTRAOPERATIVE CONSULT AND ASSIST:  [name]. Grandville Silos, M.D.  ANESTHESIA:  General.  INDICATION:  A 49 year old, gravida 1, para 54 black female with a history of endometrial ablation who has had persistent pelvic pain.  The patient's history is notable for having had a second attempt at repeat endometrial ablation at which time, the surgeon had found extensive adhesions intracavitarily and had not proceeded with the second ablation.  Given that history, the patient when she presented with abdominal pain in 2015, underwent an ultrasound x2 to look for hematometra and none was found.  Due to worsening pain, a cyclical nonbleeding pain, the patient now presents for definitive surgery.  The patient was given Cytotec to place intravaginally to assist with the dilatation of the cervix.  In anticipation of her procedure, the patient had a bowel prep and the plan had been for the approach to the surgery by placing the abdominal port site and under direct visualization directly perforate the uterus with the uterine manipulator in order to facilitate the rest of the surgery.  As such, the patient  whose consent was on the chart, was transferred to the operating room.  DESCRIPTION OF PROCEDURE:  Under adequate general anesthesia, the patient was positioned for robotic surgery.  She was sterilely prepped and draped in usual fashion.  An indwelling 3-way Foley was placed sterilely.  Examination under anesthesia revealed a slightly enlarged anteverted uterus.  No adnexal masses could be appreciated.  0.25% Marcaine was injected at the supraumbilical site.  Vertical incision was made.  Veress needle was introduced and tested with normal saline.  3 L of CO2 was insufflated.  The Veress needle was then removed.  A 12 mm disposable trocar with sleeve was introduced into the abdomen without incident.  The robotic camera was then inserted.  At that point, it was noted that the patient had multiple peritoneal inclusion like cyst arising off her colon and adhesions of the colon to the left sidewall as well.  I also noted that there was adhesions that involved potentially the posterior aspect of the uterus.  However, due to the fact that there was no uterine manipulator present, it was difficult to ascertain what the actual extent of adhesions was at that point.  Therefore, the other robotic port sites were placed with the air seal being placed as well and 2 robotic ports were placed, one on the left, one on the  right in the usual manner and using probe through the lower ports on the left, the pelvis was further inspected, at which time, it was then noted that there was bowel adherent to the posterior wall of the uterus.  The posterior cul-de-sac was partially obliterated.  There were adhesions of the rectosigmoid and sigmoid as well, the right ovary could not be seen. The right tube was dilated, swollen, but otherwise without any other findings.  The left tube also was dilated and swollen with enlarged left ovary that was encased in adhesions and adherent as well partly to the uterus and  bowel.  At that point, a decision was made for intraoperative consultation and assistance from the general surgeon, and this was being facilitated.  In the interim time, decision was then made to go ahead and proceed with trying to place the uterine manipulator.  So, I went below, put in the weighted speculum, retractor, put 0 Vicryl figure-of- eight sutures on the anterior and posterior lip of the cervix and attempted to dilate the cervix.  That was unsuccessful, that could not traverse the internal os as was not unexpected.  Therefore, dilators were then utilized and with the laparoscope being in place, a fundal perforation was then attempted.  During the process, it was felt like I had been successful as chocolate material extruded into the vagina; however I still did not see any perforation of the fundus and continued to advance the dilator.  At that point, I saw chocolate material covering the bowels on the other side intra-abdominally which then was to confirm perforation, but not in the fundal location that I had preferred. First, a medium size KOH ring with ultimately a #8 uterine manipulator was placed as the other manipulators had gone too far into the defect in the uterus by using the port sites above and probed.  I was able to then see the preparation was posterior uterus with the intracervical balloon being seen.  This did not allow for adequate manipulation of the uterus and therefore the size was continued to be decreased until it was felt to still be within the uterine cavity and able to manipulate the uterus. We waited about an hour and a half  for general surgeon, at which time, Dr. Georganna Skeans was kind enough to arrive and the findings as I had seen was reviewed with him with the hope that he would have been able to do laparoscopic resection of the adhesions and then proceed with the hysterectomy robotically.  After review by him, please see his dictated operative  note to that fact.  It was then felt that he could not comfortably and adequately take down those adhesions at the current location and so forth.  At that point, a decision was then made to open the patient.  He remained as we still needed to see the extent of the bowel involvement.  The port sites were then removed.  The abdomen was deflated and the patient remained in the dorsal lithotomy position and a Pfannenstiel skin incision was then made by me carried down to the rectus fascia.  The rectus fascia was opened transversely.  The rectus fascia was then bluntly and sharply dissected off the rectus muscle in superior and inferior fashion.  The rectus muscle was split in the midline.  The parietal peritoneum was entered and extended superiorly, inferiorly.  Of note, from the laparoscopic portion, the appendix was adherent to the right side with adhesions, but otherwise appeared normal.  Normal liver edge was noted.  The blunt and sharp dissections was noted and done by Dr. Grandville Silos, again see operative report on that matter allowing for visibility for the hysterectomy to be started.  The procedure was started on the left with a round ligament being suture ligated x2 with 0 Vicryl.  The intervening segment was opened with cautery.  The anterior leaf was carried around to the vesicouterine peritoneum.  The same procedure was performed on the right side and the bladder was then bluntly and sharply dissected off with cautery dissected off lower uterine segment displaced inferiorly.  The adhesions that had been taken off on the left resulted and there was an opening in the peritoneal reflection on the right side to the right of the rectosigmoid, but did not involve the bowels.  The retroperitoneal spaces bilaterally were opened and the ureters identified peristalsing, but deep in the pelvis.  Once these were secured, the right ovary, which could not be seen that was encased in adhesions,  decision was then made to remove that ovary.  Therefore, a window below the IP ligament was opened and free tied x3 was placed, 2 proximally, 1 distally of 0 Vicryl and intervening segment was then cut.  The peritoneum was then opened and continued towards the uterine vessels which were tortuous.  Once the uterine vessels were seen, they were skeletonized and continued effort was made to remove the peritoneum that the ureter was on and displaced it further inferiorly as we brought closer to the uterus.  The uterine vessels were then doubly clamped with Haney clamps, cut, suture ligated with 0 Vicryl x2.  On the opposite side, the plan to retain the patient's ovaries in the first place, there was a small probably a corpus luteal-type cyst on the left, which was  incidentally the contents of which were removed.  The ureter was continued to be identified on the left side and the bowel had been removed off that area, but in order to preserve that ovary, the LigaSure was then utilized to serially clamp the fallopian tube, mesosalpinx bilaterally and serially clamped, cut the tubes on both sides.  The left tube was removed, the right had remained on the specimen.  The left utero-ovarian ligament was then serially clamped, cauterized, and then cut with ultimately removal of that left ovary off the uterine attachment.  Other than adhesions, was otherwise appeared to be okay.  I fit size and then performed the disorganization of the left uterine vessels, doubly clamped those, cut and suture ligated those with 0 Vicryl x2.  The cardinal ligaments were serially clamped, suture ligated with 0 Vicryl until the cervicovaginal junction was reached.  Incidentally on the right side, with the angle being identified at that point, since it was partially open, the curved scissors were then used to circumferentially detach the cervix from its vaginal attachments and once that was done, a modified Richardson  angle suture was then placed with 0 Vicryl.  The vaginal cuff was oversewn circumferentially with 0 Vicryl suture and then the cuff was then closed with 0 Vicryl interrupted sutures.  In the findings of the bowels, there was evidence of old diverticular disease. No active diverticulitis was noted at that time and these bowels were left alone per the general surgeon.  Once this was done and the bowels appeared to have no issues, the posterior cul-de-sac was again clear from all the adhesions.  Dr. Grandville Silos then left and closed the remaining case with [facility].  The abdomen was then copiously irrigated and suctioned.  The bladder which was continued to be displaced inferiorly was inspected.  Small bleeders cauterized.  Ureters were seen peristalsing bilaterally.  Surgicel was placed on the vaginal cuff area and then the parietal peritoneum was then closed with 2-0 Vicryl.  The rectus fascia was closed with 0 Vicryl x2.  The subcutaneous area was irrigated, small bleeders cauterized.  Interrupted 2-0 plain sutures placed and the skin approximated using Ethicon staples.  The supraumbilical site fascia had been identified and 0 Vicryl figure-of- eight sutures was placed and all the other port sites were then closed with subcuticular 4-0 Vicryl sutures.  The uterine manipulator had been removed as the hysterectomy had progressed.  Specimen was the uterus with cervix, both fallopian tubes and right ovary sent to Pathology. Estimated blood loss was 500 mL.  Intraoperative fluid, 2500 ml crystalloid and albumin 500 mL.  Urine output was 285 mL, clear yellow urine.  Sponge and instrument counts x2 was correct.  Complication was none.  The patient tolerated the procedure well, was transferred to recovery room in stable condition.     Servando Salina, M.D.     [place]/MEDQ  D:  07/05/2014  T:  07/05/2014  Job:  [id number]

## 2014-07-05 NOTE — Progress Notes (Signed)
Pt verbalizes understanding of d/c instructions, medications, follow up appts, when to seek medical care and belongings policy. IV was removed without complications. Pts friend is at bedside and will be driving her home. Pt has copy of d/c instructions as well as prescription at this time. Pt to be d/c via wheelchair by Nilsa Nutting at this time. Marry Guan

## 2014-07-05 NOTE — Progress Notes (Signed)
Subjective: Patient reports tolerating PO, + flatus and no problems voiding ready to go home.    Objective: I have reviewed patient's vital signs.  vital signs, intake and output, medications and labs. Filed Vitals:   07/05/14 0530  BP: 95/57  Pulse: 93  Temp: 99.5 F (37.5 C)  Resp: 18   I/O last 3 completed shifts: In: 1435.7 [P.O.:600; I.V.:835.7] Out: 3800 [Urine:3800]    Lab Results  Component Value Date   WBC 11.0* 07/04/2014   HGB 10.1* 07/04/2014   HCT 30.2* 07/04/2014   MCV 92.4 07/04/2014   PLT 228 07/04/2014   Lab Results  Component Value Date   CREATININE 0.89 07/04/2014    EXAM General: alert, cooperative and no distress Resp: clear to auscultation bilaterally Cardio: regular rate and rhythm, S1, S2 normal, no murmur, click, rub or gallop GI: incision: clean, dry and intact and soft active BS nondistended. honeycomb dressing in place Extremities: no edema, redness or tenderness in the calves or thighs Vaginal Bleeding: none Back: no CVAT Assessment: s/p Procedure(s): ATTEMPTED ROBOTIC ASSISTED TOTAL HYSTERECTOMY BILATERAL SALPINGECTOMY HYSTERECTOMY ABDOMINAL  RIGHT OOPHORECTOMY: stable, progressing well and tolerating diet Anemia due to blood loss Plan: Encourage ambulation Discontinue IV fluids Discharge home   D/C instructions reviewed. Staple removal in office on tuesday  LOS: 2 days    Juwan Vences A, MD 07/05/2014 10:02 AM    07/05/2014, 10:02 AM

## 2014-07-05 NOTE — Discharge Instructions (Signed)
Call if temperature greater than equal to 100.4, nothing per vagina for 4-6 weeks or severe nausea vomiting, increased incisional pain , drainage or redness in the incision site, no straining with bowel movements, showers no bath °

## 2014-07-05 NOTE — Discharge Summary (Signed)
Physician Discharge Summary  Patient ID: Sylvia Mcdonald MRN: [id number] DOB/AGE: 51-29-1967 51 y.o.  Admit date: 07/03/2014 Discharge date: 07/05/2014  Admission Diagnoses: hx endometrial ablation, pelvic pain  Discharge Diagnoses:  Hx endometrial ablation, pelvic pain, diverticular disease, pelvic adhesions Stage IV pelvic endometriosis  Active Problems:   S/P hysterectomy with oophorectomy   Discharged Condition: stable  Hospital Course: pt was admitted to [facility] for surgery( see op note). Pt underwent laparoscopy with robotic camera, exp lap, TAH, RSO, left salpingectomy, extensive loa. Postop course unremarkable  Consults: general surgery  Significant Diagnostic Studies: labs:  CBC    Component Value Date/Time   WBC 11.0* 07/04/2014 0546   RBC 3.27* 07/04/2014 0546   HGB 10.1* 07/04/2014 0546   HCT 30.2* 07/04/2014 0546   PLT 228 07/04/2014 0546   MCV 92.4 07/04/2014 0546   MCH 30.9 07/04/2014 0546   MCHC 33.4 07/04/2014 0546   RDW 13.9 07/04/2014 0546      Treatments: surgery: dx lap with robotic camera, exp laparotomy, TAH, right oophrectomy, bilateral salpingectomy, extensive LOA.   Discharge Exam: Blood pressure 95/57, pulse 93, temperature 99.5 F (37.5 C), temperature source Oral, resp. rate 18, height 5' 2.5" (1.588 m), weight 81.647 kg (180 lb), SpO2 94 %. General appearance: alert, cooperative and no distress Back: no tenderness to percussion or palpation Resp: clear to auscultation bilaterally Cardio: regular rate and rhythm, S1, S2 normal, no murmur, click, rub or gallop GI: soft, non-tender; bowel sounds normal; no masses,  no organomegaly and dressing c/d/i Pelvic: deferred Extremities: no edema, redness or tenderness in the calves or thighs Skin: Skin color, texture, turgor normal. No rashes or lesions Incision/Wound: pfannenstiel skin incision d/c/i  Disposition: Final discharge disposition not confirmed  Discharge Instructions    Diet general     Complete by:  As directed      Discharge instructions    Complete by:  As directed   Call if temperature greater than equal to 100.4, nothing per vagina for 4-6 weeks or severe nausea vomiting, increased incisional pain , drainage or redness in the incision site, no straining with bowel movements, showers no bath     Increase activity slowly    Complete by:  As directed      No wound care    Complete by:  As directed             Medication List    TAKE these medications        dexlansoprazole 60 MG capsule  Commonly known as:  DEXILANT  Take 60 mg by mouth daily.     ibuprofen 800 MG tablet  Commonly known as:  ADVIL,MOTRIN  Take 1 tablet (800 mg total) by mouth every 8 (eight) hours as needed (mild pain).     Linaclotide 290 MCG Caps capsule  Commonly known as:  LINZESS  Take 290 mcg by mouth daily.     mometasone 50 MCG/ACT nasal spray  Commonly known as:  NASONEX  Place 1 spray into the nose daily as needed (for allergies/congestion).     oxyCODONE-acetaminophen 5-325 MG per tablet  Commonly known as:  PERCOCET/ROXICET  Take 1-2 tablets by mouth every 4 (four) hours as needed for severe pain (moderate to severe pain (when tolerating fluids)).           Follow-up Information    Follow up with Roni Scow A, MD In 4 weeks.   Specialty:  Obstetrics and Gynecology   Contact information:   [place]  [place] [id number] [phone]       Follow up with Marvene Staff, MD On 07/08/2014.   Specialty:  Obstetrics and Gynecology   Why:  staple removal   Contact information:   [address] [place] [address] [phone]       Signed: Bristol Soy A 07/05/2014, 10:12 AM

## 2014-07-07 ENCOUNTER — Encounter (HOSPITAL_COMMUNITY): Payer: Self-pay | Admitting: Obstetrics and Gynecology

## 2015-03-06 ENCOUNTER — Ambulatory Visit: Payer: Worker's Compensation

## 2015-03-06 ENCOUNTER — Ambulatory Visit (INDEPENDENT_AMBULATORY_CARE_PROVIDER_SITE_OTHER): Payer: Worker's Compensation | Admitting: Family Medicine

## 2015-03-06 VITALS — BP 120/72 | HR 82 | Temp 98.9°F | Resp 18 | Ht 63.0 in | Wt 182.0 lb

## 2015-03-06 DIAGNOSIS — M25561 Pain in right knee: Secondary | ICD-10-CM

## 2015-03-06 DIAGNOSIS — M79672 Pain in left foot: Secondary | ICD-10-CM | POA: Diagnosis not present

## 2015-03-06 DIAGNOSIS — S8001XA Contusion of right knee, initial encounter: Secondary | ICD-10-CM | POA: Diagnosis not present

## 2015-03-06 NOTE — Patient Instructions (Signed)
Tylenol, Advil, or Aleve as needed over-the-counter for pain. Ice over the affected areas on and off for 10 minutes for the next 2-3 days.  Elevate legs when seated, avoid prolonged walking or prolonged weightbearing to the left foot, and wear post op shoe when walking.  Follow-up with me next Wednesday after 3pm. Sooner if worse.   Contusion A contusion is a deep bruise. Contusions are the result of a blunt injury to tissues and muscle fibers under the skin. The injury causes bleeding under the skin. The skin overlying the contusion may turn blue, purple, or yellow. Minor injuries will give you a painless contusion, but more severe contusions may stay painful and swollen for a few weeks.  CAUSES  This condition is usually caused by a blow, trauma, or direct force to an area of the body. SYMPTOMS  Symptoms of this condition include:  Swelling of the injured area.  Pain and tenderness in the injured area.  Discoloration. The area may have redness and then turn blue, purple, or yellow. DIAGNOSIS  This condition is diagnosed based on a physical exam and medical history. An X-ray, CT scan, or MRI may be needed to determine if there are any associated injuries, such as broken bones (fractures). TREATMENT  Specific treatment for this condition depends on what area of the body was injured. In general, the best treatment for a contusion is resting, icing, applying pressure to (compression), and elevating the injured area. This is often called the RICE strategy. Over-the-counter anti-inflammatory medicines may also be recommended for pain control.  HOME CARE INSTRUCTIONS   Rest the injured area.  If directed, apply ice to the injured area:  Put ice in a plastic bag.  Place a towel between your skin and the bag.  Leave the ice on for 20 minutes, 2-3 times per day.  If directed, apply light compression to the injured area using an elastic bandage. Make sure the bandage is not wrapped too  tightly. Remove and reapply the bandage as directed by your health care provider.  If possible, raise (elevate) the injured area above the level of your heart while you are sitting or lying down.  Take over-the-counter and prescription medicines only as told by your health care provider. SEEK MEDICAL CARE IF:  Your symptoms do not improve after several days of treatment.  Your symptoms get worse.  You have difficulty moving the injured area. SEEK IMMEDIATE MEDICAL CARE IF:   You have severe pain.  You have numbness in a hand or foot.  Your hand or foot turns pale or cold.   This information is not intended to replace advice given to you by your health care provider. Make sure you discuss any questions you have with your health care provider.   Document Released: 11/10/2004 Document Revised: 10/22/2014 Document Reviewed: 06/18/2014 Elsevier Interactive Patient Education [employer].

## 2015-03-06 NOTE — Progress Notes (Signed)
Subjective:  By signing my name below, I, [name], attest that this documentation has been prepared under the direction and in the presence of Sylvia Ray, MD.  Electronically Signed: [name], ED Scribe. 03/06/2015. 2:43 PM.  Patient ID: Sylvia Mcdonald, female    DOB: May 13, 1965, 50 y.o.   MRN: [id number]  Fall Pertinent negatives include no numbness.     Chief Complaint  Patient presents with  . Knee Injury    rt   . Ankle Injury    lt   . Fall    today    HPI Comments: Sylvia Mcdonald is a 50 y.o. female who presents to the [facility] for injury that occurred at work. 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 After walking outside in the rain, states she inverted left foot on a freshly glossed floor,  landing on right knee. She reports initial right knee pain and left foot pain that has gradually worsened.  Of left foot, she locates pain to dorsum of foot and some pain in the ankle described as throbbing, sharp shooting pain especially with bearing weight and with walking. She reports more pain with touch of the right knee than with bearing weight. She has not tried treatments for pain but has tried resting occasionally throughout the day.     Allergies  Allergen Reactions  . Penicillins Rash   Prior to Admission medications   Medication Sig Start Date End Date Taking? Authorizing Provider  dexlansoprazole (DEXILANT) 60 MG capsule Take 60 mg by mouth daily.   Yes Historical Provider, MD  Linaclotide (LINZESS) 290 MCG CAPS capsule Take 290 mcg by mouth daily.   Yes Historical Provider, MD  ibuprofen (ADVIL,MOTRIN) 800 MG tablet Take 1 tablet (800 mg total) by mouth every 8 (eight) hours as needed (mild pain). Patient not taking: Reported on 03/06/2015 07/05/14   Servando Salina, MD  mometasone (NASONEX) 50 MCG/ACT nasal spray Place 1 spray into the nose daily as needed (for allergies/congestion). Reported on 03/06/2015    Historical Provider, MD    oxyCODONE-acetaminophen (PERCOCET/ROXICET) 5-325 MG per tablet Take 1-2 tablets by mouth every 4 (four) hours as needed for severe pain (moderate to severe pain (when tolerating fluids)). Patient not taking: Reported on 03/06/2015 07/05/14   Servando Salina, MD   Review of Systems  Musculoskeletal: Positive for myalgias and arthralgias. Negative for joint swelling and gait problem.  Neurological: Negative for weakness and numbness.  Hematological: Bruises/bleeds easily.   Objective:   Physical Exam  Constitutional: She is oriented to person, place, and time. She appears well-developed and well-nourished. No distress.  HENT:  Head: Normocephalic and atraumatic.  Eyes: Conjunctivae and EOM are normal.  Neck: Neck supple.  Cardiovascular: Normal rate.   Pulmonary/Chest: Effort normal.  Musculoskeletal: Normal range of motion.  Left knee-is non tender including fibular head. No bony tenderness. Left ankle-Full ROM. No bony tenderness. No apparent soft tissue swelling. NVI distally. Cap refill <2 sec, DP 2+.  Left toes- non tender. 5th MT non tender. TTP at the base 2nd and 3rd MT. MT head non tender.  Right ankle -is non tender.  tib fib on right is non tender.  Right knee-skin intact. Small abrasion anteriorly over the patella. Full flexion and extension. Ecchymosis inferior to abrasion, anterior to patella. TTP over the ecchymosis. Extensor mechanism intact. Negative valgus and Varus testing. Negative McMurrays. Negative anterior and posterior drawers.   Neurological: She is alert and oriented to person, place,  and time.  Skin: Skin is warm and dry.  Psychiatric: She has a normal mood and affect. Her behavior is normal.  Nursing note and vitals reviewed.  Filed Vitals:   03/06/15 1438  BP: 120/72  Pulse: 82  Temp: 98.9 F (37.2 C)  TempSrc: Oral  Resp: 18  Height: 5\' 3"  (1.6 m)  Weight: 182 lb (82.555 kg)  SpO2: 96%   [facility] reading (PRIMARY) by Dr. Carlota Raspberry.  Right knee-  possible slight irregularity of anterior patella surface without discrete fx Left foot- No apparent fx.   Assessment & Plan:  Sylvia Mcdonald is a 50 y.o. female Right knee pain - Plan: DG Knee Complete 4 Views Right  Left foot pain - Plan: DG Foot Complete Left  Knee contusion, right, initial encounter  Pain of left midfoot  Contusion to right knee and possible mild mid foot sprain of the left foot after fall at work today. No apparent fracture seen in the patella or the midfoot, no apparent splay or change in spacing of the midfoot to suggest Lisfranc injury., but if persistent pain, weightbearing view to look for midfoot sprain may be indicated.  -  Placed in a postop shoe with improvement in symptoms, so crutches were deferred at this time. Over-the-counter Tylenol, Advil, or Aleve, ice, elevate, and work restrictions for the next 5 days with recheck at that time.  - See Worker's Comp. Paperwork for restrictions.   No orders of the defined types were placed in this encounter.   Patient Instructions  Tylenol, Advil, or Aleve as needed over-the-counter for pain. Ice over the affected areas on and off for 10 minutes for the next 2-3 days.  Elevate legs when seated, avoid prolonged walking or prolonged weightbearing to the left foot, and wear post op shoe when walking.  Follow-up with me next Wednesday after 3pm. Sooner if worse.   Contusion A contusion is a deep bruise. Contusions are the result of a blunt injury to tissues and muscle fibers under the skin. The injury causes bleeding under the skin. The skin overlying the contusion may turn blue, purple, or yellow. Minor injuries will give you a painless contusion, but more severe contusions may stay painful and swollen for a few weeks.  CAUSES  This condition is usually caused by a blow, trauma, or direct force to an area of the body. SYMPTOMS  Symptoms of this condition include:  Swelling of the injured area.  Pain and tenderness in  the injured area.  Discoloration. The area may have redness and then turn blue, purple, or yellow. DIAGNOSIS  This condition is diagnosed based on a physical exam and medical history. An X-Mcdonald, CT scan, or MRI may be needed to determine if there are any associated injuries, such as broken bones (fractures). TREATMENT  Specific treatment for this condition depends on what area of the body was injured. In general, the best treatment for a contusion is resting, icing, applying pressure to (compression), and elevating the injured area. This is often called the RICE strategy. Over-the-counter anti-inflammatory medicines may also be recommended for pain control.  HOME CARE INSTRUCTIONS   Rest the injured area.  If directed, apply ice to the injured area:  Put ice in a plastic bag.  Place a towel between your skin and the bag.  Leave the ice on for 20 minutes, 2-3 times per day.  If directed, apply light compression to the injured area using an elastic bandage. Make sure the bandage is not wrapped  too tightly. Remove and reapply the bandage as directed by your health care provider.  If possible, raise (elevate) the injured area above the level of your heart while you are sitting or lying down.  Take over-the-counter and prescription medicines only as told by your health care provider. SEEK MEDICAL CARE IF:  Your symptoms do not improve after several days of treatment.  Your symptoms get worse.  You have difficulty moving the injured area. SEEK IMMEDIATE MEDICAL CARE IF:   You have severe pain.  You have numbness in a hand or foot.  Your hand or foot turns pale or cold.   This information is not intended to replace advice given to you by your health care provider. Make sure you discuss any questions you have with your health care provider.   Document Released: 11/10/2004 Document Revised: 10/22/2014 Document Reviewed: 06/18/2014 Elsevier Interactive Patient Education [employer].      I personally performed the services described in this documentation, which was scribed in my presence. The recorded information has been reviewed and considered, and addended by me as needed.

## 2015-03-11 ENCOUNTER — Ambulatory Visit (INDEPENDENT_AMBULATORY_CARE_PROVIDER_SITE_OTHER): Payer: Worker's Compensation | Admitting: Family Medicine

## 2015-03-11 VITALS — BP 128/80 | HR 84 | Temp 98.5°F | Resp 18 | Ht 63.0 in | Wt 183.4 lb

## 2015-03-11 DIAGNOSIS — M79672 Pain in left foot: Secondary | ICD-10-CM | POA: Diagnosis not present

## 2015-03-11 DIAGNOSIS — S8001XD Contusion of right knee, subsequent encounter: Secondary | ICD-10-CM | POA: Diagnosis not present

## 2015-03-11 NOTE — Progress Notes (Signed)
Subjective:  By signing my name below, I, [name], attest that this documentation has been prepared under the direction and in the presence of [name], MD. Electronically Signed: [name], [place]. 03/11/2015 , 7:11 PM .  Patient was seen in Room 8 .   Patient ID: Sylvia Mcdonald, female    DOB: 1965/06/24, 50 y.o.   MRN: [id number] Chief Complaint  Patient presents with  . Follow-up    [place] follow-up (recheck right knee & left foot)   HPI Sylvia Mcdonald is a 50 y.o. female Pt is here for follow up injury which occurred at work. She was seen 5 days ago.  Pt's injury occurred after slipping on wet floor, suspected right knee contusion, left mid foot sprain. She was placed in post op shoe. She was instructed to avoid direct pressure to knee, treat with OTC nsaid or tylenol and placed on restrictions. Xrays were negative of right knee and left foot. Here for follow up.   She states that her right knee is sore when bumped into it, and also some discomfort when walking down the stairs. She notes that her left foot doesn't have anymore pain, and slight soreness in ankle when she walks for long distances. She took an ibuprofen after the visit 5 days ago. She denies taking any other medications since.   She rates being at about 80% improvement today.  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   Allergies  Allergen Reactions  . Penicillins Rash   Review of Systems  Constitutional: Negative for fever, chills and fatigue.  Gastrointestinal: Negative for nausea, vomiting, abdominal pain, diarrhea and constipation.  Musculoskeletal: Positive for myalgias and arthralgias. Negative for joint swelling and gait problem.  Skin: Negative for rash and wound.      Objective:   Physical Exam  Constitutional: She is oriented to person, place, and time. She appears well-developed and well-nourished. No distress.  HENT:  Head: Normocephalic and atraumatic.  Eyes: EOM are normal.  Pupils are equal, round, and reactive to light.  Neck: Neck supple.  Cardiovascular: Normal rate.   Pulmonary/Chest: Effort normal. No respiratory distress.  Musculoskeletal: Normal range of motion.  Left foot: metatarsal nontender, 5th metatarsal non tender, navicula nontender, dorsum midfoot minimally tender at base of 3rd metatarsal including loading that joint, full ROM ankle malleoli nontender, achilles nontender, NVI distally  Right knee: full rom, no effusion, negative posterior and anterior drawer, negative varus, negative valgus, small patch of some ecchymosis over anterior patella, slightly tender at patellar hole and distal knee  Neurological: She is alert and oriented to person, place, and time.  Skin: Skin is warm and dry.  Psychiatric: She has a normal mood and affect. Her behavior is normal.  Nursing note and vitals reviewed.   Filed Vitals:   03/11/15 1800  BP: 128/80  Pulse: 84  Temp: 98.5 F (36.9 C)  TempSrc: Oral  Resp: 18  Height: 5\' 3"  (1.6 m)  Weight: 183 lb 6 oz (83.178 kg)  SpO2: 97%      Assessment & Plan:  [name] is a 50 y.o. female Patellar contusion, right, subsequent encounter  Left foot pain  Left foot pain/mid foot pain/sprain and right patellar contusion due to fall/injury at work. Much improved at 80% improvement. Reassuring knee exam other than what appears be superficial bruising and patella or patellar tendon contusion. Full strength and range of motion. Only minimal tenderness over dorsal foot, no significant swelling, no ecchymosis,  reassuring exam.  -Can transition out of postop shoe as symptoms improve. Avoid direct pressure onto the right knee, but no restrictions necessary at this time. Symptomatic care discussed.  -Follow-up in 2-3 weeks. Sooner if worse.  No orders of the defined types were placed in this encounter.   Patient Instructions  Okay to take Advil, Aleve, or Tylenol over-the-counter only if needed. Avoid direct  pressure to your right knee, but otherwise no restrictions. For your left foot, okay to try transitioning to regular shoe with support, avoid heels, or can wear the postop shoe if it feels more stable temporarily.  Follow-up with me in 2 weeks. Sooner if worse..     I personally performed the services described in this documentation, which was scribed in my presence. The recorded information has been reviewed and considered, and addended by me as needed.

## 2015-03-11 NOTE — Patient Instructions (Signed)
Okay to take Advil, Aleve, or Tylenol over-the-counter only if needed. Avoid direct pressure to your right knee, but otherwise no restrictions. For your left foot, okay to try transitioning to regular shoe with support, avoid heels, or can wear the postop shoe if it feels more stable temporarily.  Follow-up with me in 2 weeks. Sooner if worse.Marland Kitchen

## 2015-04-01 ENCOUNTER — Ambulatory Visit: Payer: Self-pay | Admitting: Family Medicine

## 2015-04-08 ENCOUNTER — Ambulatory Visit (INDEPENDENT_AMBULATORY_CARE_PROVIDER_SITE_OTHER): Payer: Worker's Compensation | Admitting: Family Medicine

## 2015-04-08 ENCOUNTER — Encounter: Payer: Self-pay | Admitting: Family Medicine

## 2015-04-08 VITALS — BP 110/82 | HR 80 | Temp 98.4°F | Resp 16 | Ht 62.0 in | Wt 184.2 lb

## 2015-04-08 DIAGNOSIS — M79672 Pain in left foot: Secondary | ICD-10-CM | POA: Diagnosis not present

## 2015-04-08 NOTE — Progress Notes (Signed)
   Subjective:    Patient ID: Sylvia Mcdonald, female    DOB: 09-05-1965, 50 y.o.   MRN: [id number] By signing my name below, I, Zola Button, attest that this documentation has been prepared under the direction and in the presence of [name], MD.  Electronically Signed: Zola Button, Medical Scribe. 04/08/2015. 12:31 PM.  HPI HPI Comments: Sylvia Mcdonald is a 50 y.o. female who presents to the [facility] for a follow-up. Last seen for left foot pain and right knee contusion after injury which occurred at work. Date of injury was 1/20. She was overall improved at last visit. Was transitioned out of post-op shoe as symptoms had continued to improve. Regular shoe as tolerated. Aleve or Tylenol as needed. She was 80% improved last visit on the 25th. Patient has not had any pain and reports having 99% improvement now. She has not had any problems with her knee.   Allergies  Allergen Reactions  . Penicillins Rash   Prior to Admission medications   Medication Sig Start Date End Date Taking? Authorizing Provider  dexlansoprazole (DEXILANT) 60 MG capsule Take 60 mg by mouth daily.    Historical Provider, MD  Linaclotide Rolan Lipa) 290 MCG CAPS capsule Take 290 mcg by mouth daily.    Historical Provider, MD  mometasone (NASONEX) 50 MCG/ACT nasal spray Place 1 spray into the nose daily as needed (for allergies/congestion). Reported on 03/06/2015    Historical Provider, MD   Review of Systems  Musculoskeletal: Negative for myalgias and arthralgias.       Objective:   Physical Exam  Constitutional: She is oriented to person, place, and time. She appears well-developed and well-nourished. No distress.  HENT:  Head: Normocephalic and atraumatic.  Mouth/Throat: Oropharynx is clear and moist. No oropharyngeal exudate.  Eyes: Pupils are equal, round, and reactive to light.  Neck: Neck supple.  Cardiovascular: Normal rate.   Pulmonary/Chest: Effort normal.  Musculoskeletal: She  exhibits no edema.  No bony tenderness. Full ROM.  Neurological: She is alert and oriented to person, place, and time. No cranial nerve deficit.  Full strength.  Skin: Skin is warm and dry. No rash noted.  Skin intact, no ecchymoses.  Psychiatric: She has a normal mood and affect. Her behavior is normal.  Nursing note and vitals reviewed.   Filed Vitals:   04/08/15 1220  BP: 110/82  Pulse: 80  Temp: 98.4 F (36.9 C)  TempSrc: Oral  Resp: 16  Height: 5\' 2"  (1.575 m)  Weight: 184 lb 3.2 oz (83.553 kg)  SpO2: 96%         Assessment & Plan:  Sylvia Mcdonald is a 50 y.o. female Left foot pain Left foot pain/sprain, and right knee contusion from injury at work. Date of injury 03/06/2015. Now at maximal medical improvement without pain on exam or difficulty in function. No restrictions needed or follow-up needed at this time as long as she remains asymptomatic. See paperwork for employer. RTC precautions  No orders of the defined types were placed in this encounter.   Patient Instructions  No concerns on your exam today.  No restrictions. If pain returns, follow up as needed.       I personally performed the services described in this documentation, which was scribed in my presence. The recorded information has been reviewed and considered, and addended by me as needed.

## 2015-04-08 NOTE — Patient Instructions (Addendum)
No concerns on your exam today.  No restrictions. If pain returns, follow up as needed.

## 2016-01-31 IMAGING — CR DG FOOT COMPLETE 3+V*L*
3 series · 3 of 3 positions shown · non-contrast
Comparison: None.

CLINICAL DATA: Left foot pain. Fifth metatarsal fracture with
injury 11/22/2013. Subsequent encounter

EXAM:
LEFT FOOT - COMPLETE 3+ VIEW

[AP]
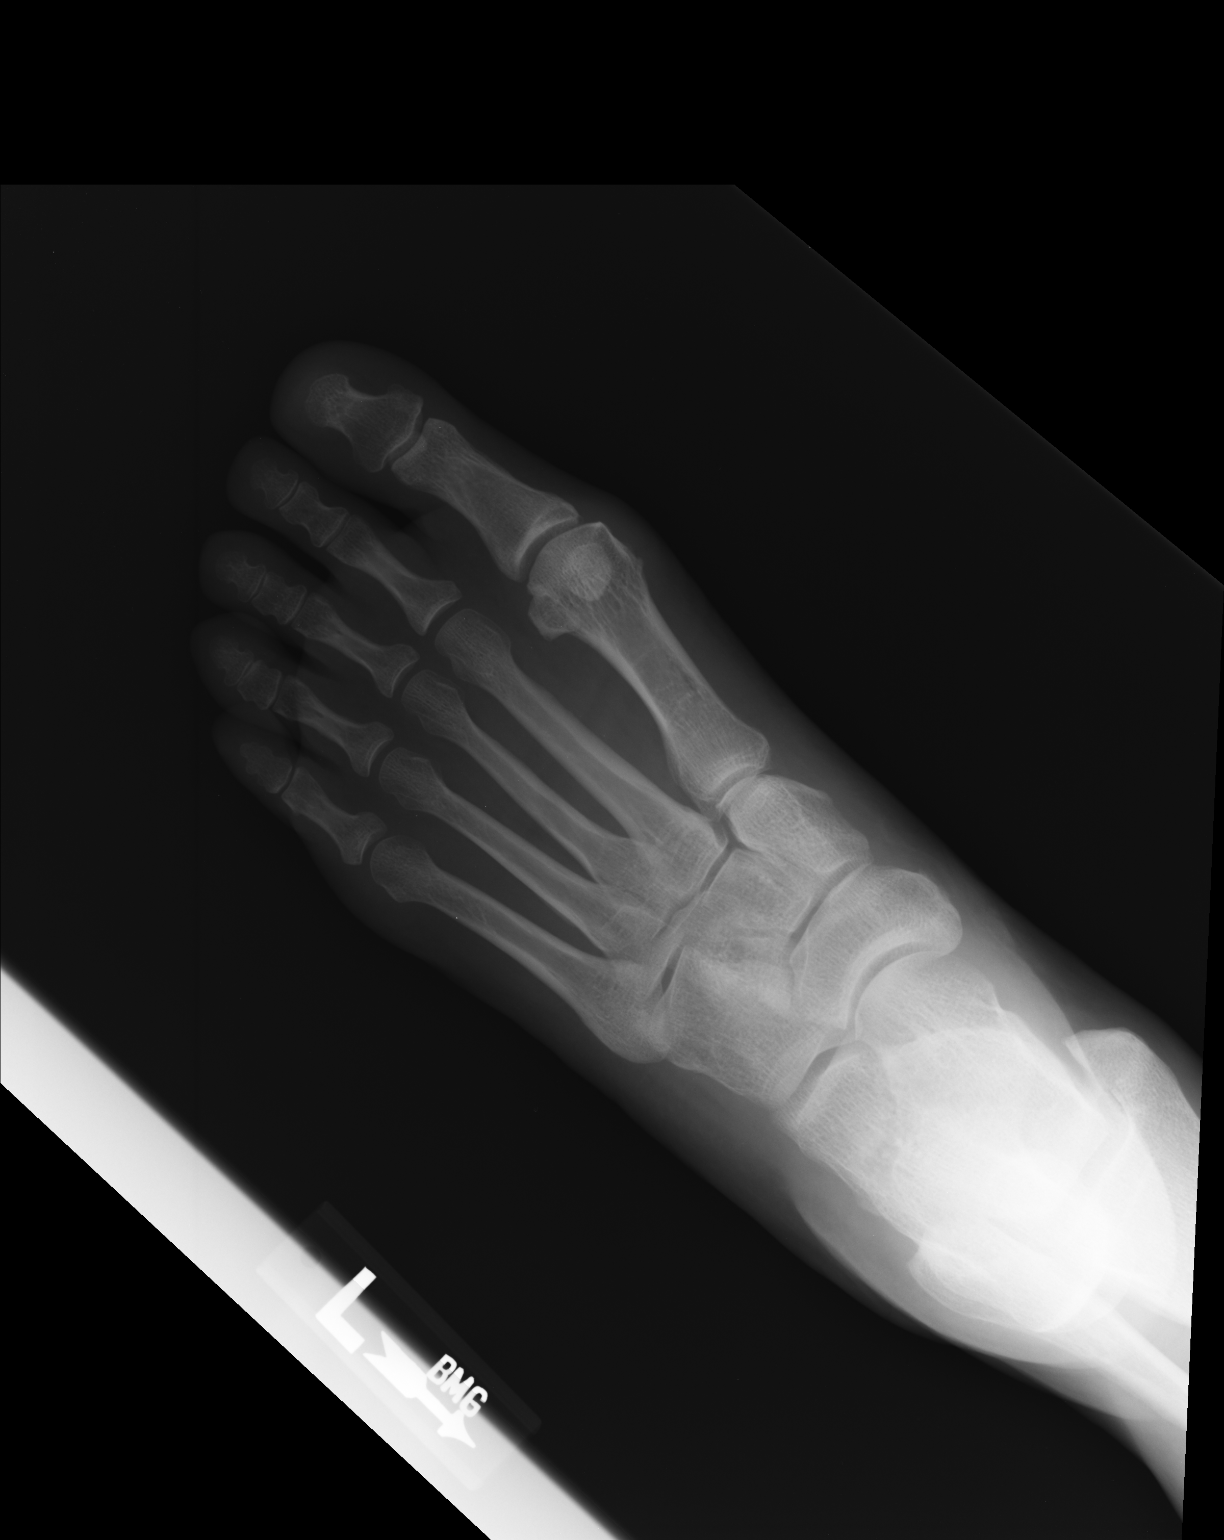

[ap obl int rot]
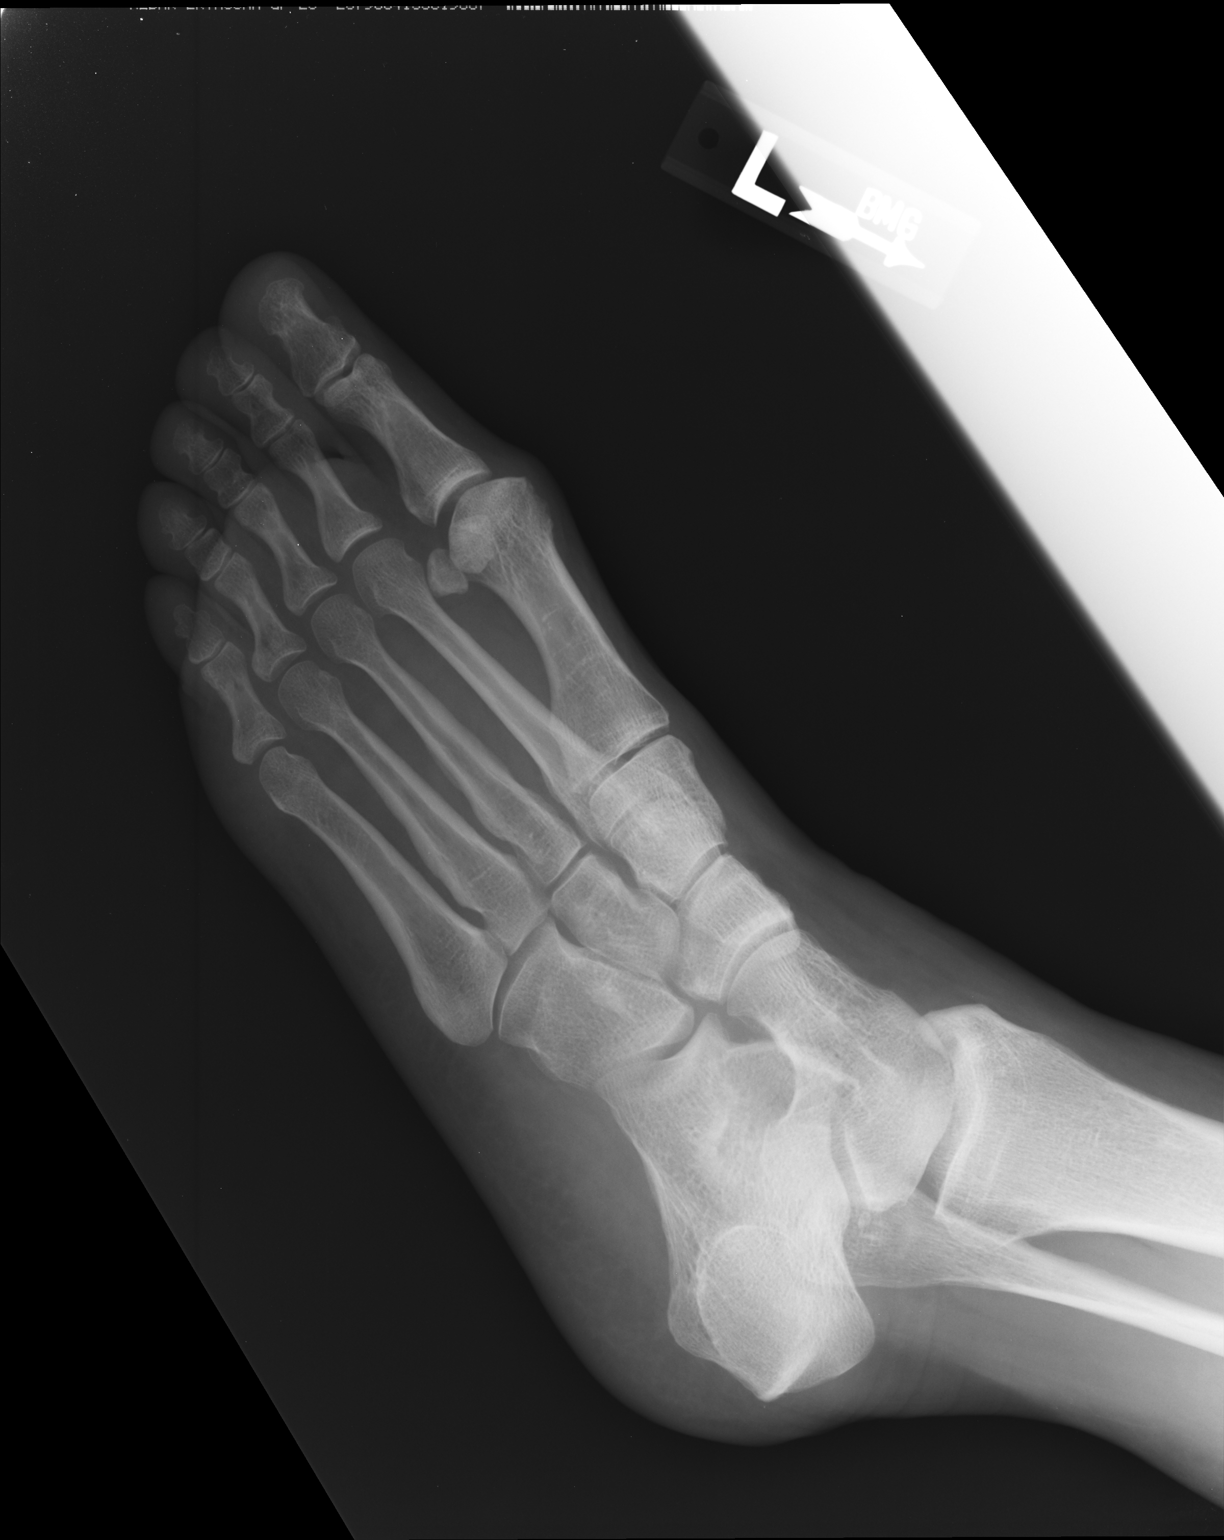

[lateral]
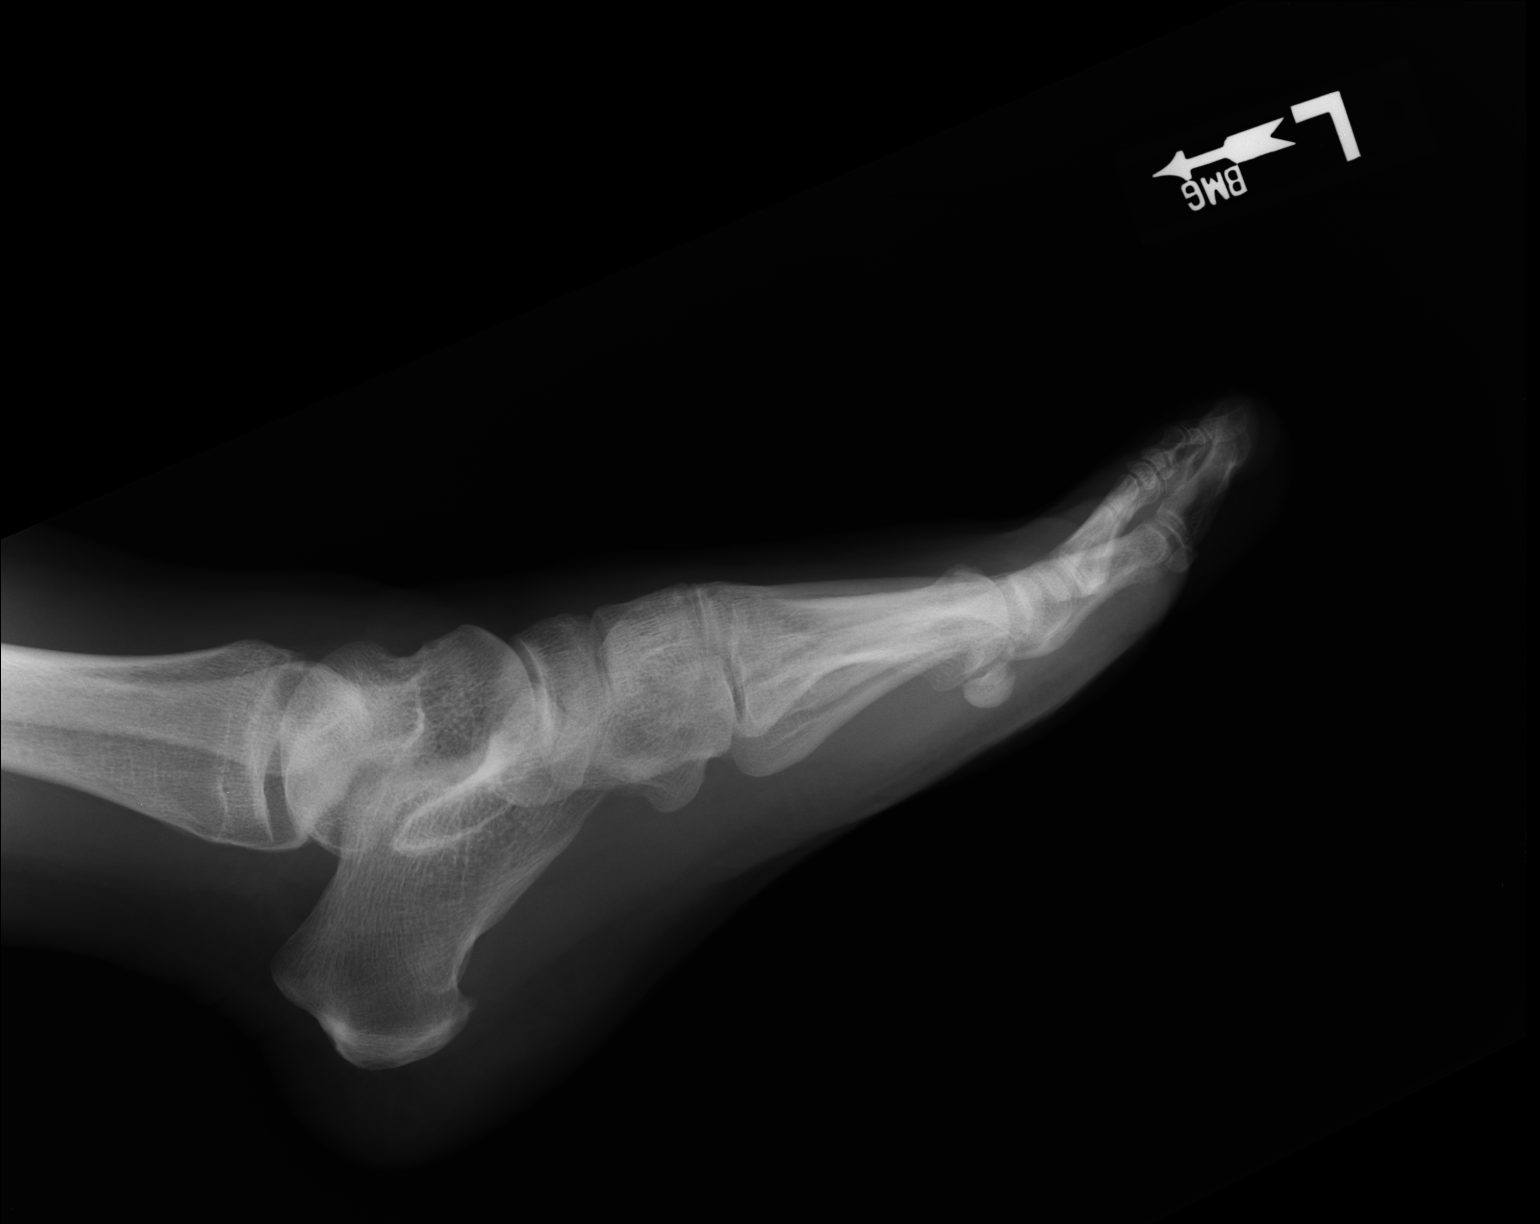

[3 of 3 positions shown; findings below may reference images not displayed]

FINDINGS: There is no evidence of fracture or dislocation. There is no
evidence of arthropathy or other focal bone abnormality. Soft
tissues are unremarkable.
IMPRESSION: Negative.
